# Patient Record
Sex: Female | Born: 1990 | Race: Black or African American | Hispanic: No | Marital: Single | Smoking: Current every day smoker
Health system: Southern US, Community
[De-identification: ages and names within clinical notes are randomized; demographics above are authoritative.]

## PROBLEM LIST (undated history)

## (undated) DIAGNOSIS — J45909 Unspecified asthma, uncomplicated: Secondary | ICD-10-CM

---
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
## 2010-08-23 ENCOUNTER — Emergency Department: Payer: Self-pay | Admitting: Emergency Medicine

## 2010-10-12 ENCOUNTER — Emergency Department: Payer: Self-pay | Admitting: Internal Medicine

## 2011-09-26 ENCOUNTER — Emergency Department: Payer: Self-pay | Admitting: Emergency Medicine

## 2011-10-19 ENCOUNTER — Emergency Department: Payer: Self-pay | Admitting: Emergency Medicine

## 2011-10-19 LAB — COMPREHENSIVE METABOLIC PANEL
Albumin: 3.6 g/dL (ref 3.4–5.0)
Alkaline Phosphatase: 83 U/L (ref 50–136)
Anion Gap: 7 (ref 7–16)
BUN: 9 mg/dL (ref 7–18)
Bilirubin,Total: 0.3 mg/dL (ref 0.2–1.0)
Calcium, Total: 9.1 mg/dL (ref 8.5–10.1)
Chloride: 106 mmol/L (ref 98–107)
Co2: 27 mmol/L (ref 21–32)
EGFR (African American): >60
EGFR (Non-African Amer.): >60
Potassium: 4 mmol/L (ref 3.5–5.1)
SGOT(AST): 59 U/L — ABNORMAL HIGH (ref 15–37)
Total Protein: 8.1 g/dL (ref 6.4–8.2)

## 2011-10-19 LAB — CBC
HCT: 37.3 % (ref 35.0–47.0)
HGB: 12.3 g/dL (ref 12.0–16.0)
MCHC: 33 g/dL (ref 32.0–36.0)
MCV: 95 fL (ref 80–100)
Platelet: 368 10*3/uL (ref 150–440)
WBC: 7.3 10*3/uL (ref 3.6–11.0)

## 2011-10-19 LAB — URINALYSIS, COMPLETE
Glucose,UR: NEGATIVE mg/dL (ref 0–75)
Nitrite: NEGATIVE
Protein: NEGATIVE
RBC,UR: 1 /HPF (ref 0–5)
Specific Gravity: 1.025 (ref 1.003–1.030)
Squamous Epithelial: 1
WBC UR: <1 /HPF (ref 0–5)

## 2011-10-19 LAB — DRUG SCREEN, URINE
Amphetamines, Ur Screen: NEGATIVE (ref ?–1000)
Barbiturates, Ur Screen: NEGATIVE (ref ?–200)
Cannabinoid 50 Ng, Ur ~~LOC~~: NEGATIVE (ref ?–50)
Cocaine Metabolite,Ur ~~LOC~~: NEGATIVE (ref ?–300)
Phencyclidine (PCP) Ur S: NEGATIVE (ref ?–25)

## 2011-12-12 ENCOUNTER — Emergency Department: Payer: Self-pay | Admitting: Emergency Medicine

## 2012-01-22 ENCOUNTER — Emergency Department: Payer: Self-pay | Admitting: Emergency Medicine

## 2012-01-23 LAB — COMPREHENSIVE METABOLIC PANEL
Albumin: 3.1 g/dL — ABNORMAL LOW (ref 3.4–5.0)
Anion Gap: 14 (ref 7–16)
Bilirubin,Total: 0.3 mg/dL (ref 0.2–1.0)
Chloride: 107 mmol/L (ref 98–107)
Co2: 22 mmol/L (ref 21–32)
Creatinine: 1.4 mg/dL — ABNORMAL HIGH (ref 0.60–1.30)
EGFR (African American): >60
EGFR (Non-African Amer.): 54 — ABNORMAL LOW
Glucose: 71 mg/dL (ref 65–99)
Osmolality: 282 (ref 275–301)
Potassium: 3.4 mmol/L — ABNORMAL LOW (ref 3.5–5.1)
SGOT(AST): 31 U/L (ref 15–37)
SGPT (ALT): 17 U/L (ref 12–78)
Sodium: 143 mmol/L (ref 136–145)
Total Protein: 6.9 g/dL (ref 6.4–8.2)

## 2012-01-23 LAB — DRUG SCREEN, URINE
Amphetamines, Ur Screen: NEGATIVE (ref ?–1000)
Barbiturates, Ur Screen: NEGATIVE (ref ?–200)
Benzodiazepine, Ur Scrn: NEGATIVE (ref ?–200)
Cannabinoid 50 Ng, Ur ~~LOC~~: POSITIVE (ref ?–50)
MDMA (Ecstasy)Ur Screen: NEGATIVE (ref ?–500)
Methadone, Ur Screen: NEGATIVE (ref ?–300)
Phencyclidine (PCP) Ur S: NEGATIVE (ref ?–25)
Tricyclic, Ur Screen: NEGATIVE (ref ?–1000)

## 2012-01-23 LAB — CBC
HCT: 39.8 % (ref 35.0–47.0)
MCH: 30.9 pg (ref 26.0–34.0)
MCV: 94 fL (ref 80–100)
RBC: 4.22 10*6/uL (ref 3.80–5.20)
RDW: 14.2 % (ref 11.5–14.5)
WBC: 15.5 10*3/uL — ABNORMAL HIGH (ref 3.6–11.0)

## 2012-01-23 LAB — ETHANOL
Ethanol %: <0.003 % (ref 0.000–0.080)
Ethanol: <3 mg/dL

## 2012-01-23 LAB — TSH: Thyroid Stimulating Horm: 0.82 u[IU]/mL

## 2013-06-12 ENCOUNTER — Emergency Department: Payer: Self-pay | Admitting: Internal Medicine
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
## 2014-08-26 NOTE — Consult Note (Signed)
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   Patient was seen by consultant.   Consult note dictated.   Recommend further assessment or treatment.   Orders entered.   Discussed with Attending MD.   Comments: Ms. Lamount CrankerChandrika has no psychiatric history except for [facility] for worsening depression in the context of severe skin condition-eczema. She has been referred [facility] and rheumatology but was unable to attend. Neither she was able to go [facility] to obtain free medications there. She reportedly threatened a suicide "unless her skin condition improves". She denies thoughts of hurting herself or others. She [facility].   PLAN: 1. She no longer meets criteria for IVC. I will terminate proceedings. Please discharge as appropriate.   2. She is to continue all medications as prescribed [facility]. Per her discharge list she is only on Neurontin 300 mg at night. She is on multiple creams.   3. She was given ALAMAP application to increase treatment compliance. I will provide Rx to go with it.   4. She will [facility].  Electronic Signatures: Kristine LineaPucilowska, Tyse Auriemma (MD)  (Signed 16-Sep-13 17:32)  Authored: Brief Consult Note   Last Updated: 16-Sep-13 17:32 by Kristine LineaPucilowska, Trezure Cronk (MD)
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
## 2014-10-20 ENCOUNTER — Emergency Department: Admission: EM | Admit: 2014-10-20 | Discharge: 2014-10-20 | Discharge status: Absent without leave | Payer: Self-pay

## 2017-10-16 ENCOUNTER — Emergency Department
Admission: EM | Admit: 2017-10-16 | Discharge: 2017-10-16 | Disposition: A | Discharge status: Home / Self Care | Payer: Self-pay | Attending: Emergency Medicine | Admitting: Emergency Medicine

## 2017-10-16 ENCOUNTER — Other Ambulatory Visit: Payer: Self-pay

## 2017-10-16 DIAGNOSIS — Z5321 Procedure and treatment not carried out due to patient leaving prior to being seen by health care provider: Secondary | ICD-10-CM | POA: Insufficient documentation

## 2017-10-16 DIAGNOSIS — Y999 Unspecified external cause status: Secondary | ICD-10-CM | POA: Insufficient documentation

## 2017-10-16 DIAGNOSIS — S0101XA Laceration without foreign body of scalp, initial encounter: Secondary | ICD-10-CM | POA: Insufficient documentation

## 2017-10-16 DIAGNOSIS — Y939 Activity, unspecified: Secondary | ICD-10-CM | POA: Insufficient documentation

## 2017-10-16 DIAGNOSIS — Y929 Unspecified place or not applicable: Secondary | ICD-10-CM | POA: Insufficient documentation
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
## 2017-11-04 ENCOUNTER — Other Ambulatory Visit: Payer: Self-pay

## 2017-11-04 ENCOUNTER — Emergency Department
Admission: EM | Admit: 2017-11-04 | Discharge: 2017-11-04 | Disposition: A | Discharge status: Home / Self Care | Payer: Self-pay | Attending: Emergency Medicine | Admitting: Emergency Medicine

## 2017-11-04 ENCOUNTER — Encounter: Payer: Self-pay | Admitting: *Deleted

## 2017-11-04 DIAGNOSIS — S0101XD Laceration without foreign body of scalp, subsequent encounter: Secondary | ICD-10-CM | POA: Insufficient documentation

## 2017-11-04 DIAGNOSIS — Z4802 Encounter for removal of sutures: Secondary | ICD-10-CM

## 2017-11-04 DIAGNOSIS — X58XXXD Exposure to other specified factors, subsequent encounter: Secondary | ICD-10-CM | POA: Insufficient documentation
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
## 2017-11-04 NOTE — Discharge Instructions (Addendum)
Follow-up with your primary care or [facility] acute care if any continued problems.

## 2017-11-04 NOTE — ED Triage Notes (Signed)
Pt here to have three staples removed from the right side of head. Pt reports headaches but denies fevers or drainage from wound. Staples have been present for three weeks.

## 2019-02-06 ENCOUNTER — Emergency Department: Payer: HRSA Program

## 2019-02-06 ENCOUNTER — Other Ambulatory Visit: Payer: Self-pay

## 2019-02-06 ENCOUNTER — Emergency Department
Admission: EM | Admit: 2019-02-06 | Discharge: 2019-02-06 | Disposition: A | Discharge status: Home / Self Care | Payer: HRSA Program | Attending: Emergency Medicine | Admitting: Emergency Medicine

## 2019-02-06 ENCOUNTER — Encounter: Payer: Self-pay | Admitting: Emergency Medicine

## 2019-02-06 DIAGNOSIS — J069 Acute upper respiratory infection, unspecified: Secondary | ICD-10-CM | POA: Diagnosis not present

## 2019-02-06 DIAGNOSIS — Z20828 Contact with and (suspected) exposure to other viral communicable diseases: Secondary | ICD-10-CM | POA: Insufficient documentation

## 2019-02-06 DIAGNOSIS — J45901 Unspecified asthma with (acute) exacerbation: Secondary | ICD-10-CM | POA: Diagnosis not present

## 2019-02-06 DIAGNOSIS — R05 Cough: Secondary | ICD-10-CM | POA: Diagnosis present

## 2019-02-06 DIAGNOSIS — R059 Cough, unspecified: Secondary | ICD-10-CM

## 2019-02-06 MED ORDER — METHYLPREDNISOLONE SODIUM SUCC 125 MG IJ SOLR
125.0000 mg | Freq: Once | INTRAMUSCULAR | Status: AC
  Administered 2019-02-06: 21:00:00 125 mg via INTRAMUSCULAR
  Filled 2019-02-06: qty 2

## 2019-02-06 MED ORDER — PREDNISONE 20 MG PO TABS: 40.0000 mg | ORAL_TABLET | Freq: Every day | ORAL | 0 refills | Status: DC

## 2019-02-06 NOTE — ED Notes (Signed)
E-signature not working at this time. Pt verbalized understanding of D/C instructions, prescriptions and follow up care with no further questions at this time. Pt in NAD and ambulatory at time of D/C.  

## 2019-02-06 NOTE — Discharge Instructions (Signed)
Please stay at home and quarantine the next 2 days until your tests have resulted.  If your cover test comes back positive, you need to continue with quarantine for total of 10 days.  If any point you become short of breath, develop chest pain, worsening wheezing, fevers, or any urgent changes in your health please return to the emergency department.  Please continue with albuterol inhaler 2 puffs every 6 hours and take prednisone as prescribed for the next 5 days.

## 2019-02-06 NOTE — ED Triage Notes (Signed)
PT here to be tested for covid. + cough and body aches x2 days. PT appears fatigue

## 2019-02-06 NOTE — ED Provider Notes (Signed)
[facility] EMERGENCY DEPARTMENT Provider Note   CSN: [id number] Arrival date & time: 02/06/19  1831     History   Chief Complaint Chief Complaint  Patient presents with  . Cough    HPI [name] is a 28 y.o. female.  Presents the emergency department for evaluation of nonproductive cough, body aches wheezing and fatigue.  Symptoms been present for 2 days.  She denies any chest pain, shortness of breath.  Has a history of asthma, on albuterol but has not used this medication today.  Patient states she took 3 puffs of albuterol yesterday without relief.  She denies any fevers, chills, chest pain, shortness of breath.  No abdominal pain nausea vomiting or diarrhea.  No loss of smell or taste.  No known contacts with COVID.     HPI  History reviewed. No pertinent past medical history.  There are no active problems to display for this patient.   History reviewed. No pertinent surgical history.   OB History   No obstetric history on file.      Home Medications    Prior to Admission medications   Medication Sig Start Date End Date Taking? Authorizing Provider  predniSONE (DELTASONE) 20 MG tablet Take 2 tablets (40 mg total) by mouth daily. 02/06/19   Duanne Guess, PA-C    Family History No family history on file.  Social History Social History   Tobacco Use  . Smoking status: Never Smoker  . Smokeless tobacco: Never Used  Substance Use Topics  . Alcohol use: Yes    Frequency: Never    Comment: occasionaly  . Drug use: Never     Allergies   Patient has no known allergies.   Review of Systems Review of Systems  Constitutional: Negative for chills and fever.  HENT: Negative for congestion, ear discharge, rhinorrhea, sinus pressure, sinus pain, sore throat, trouble swallowing and voice change.   Respiratory: Positive for cough and wheezing. Negative for chest tightness, shortness of breath and stridor.   Cardiovascular:  Negative for chest pain.  Gastrointestinal: Negative for abdominal pain, diarrhea, nausea and vomiting.  Genitourinary: Negative for dysuria, flank pain and pelvic pain.  Musculoskeletal: Positive for myalgias. Negative for back pain.  Skin: Negative for rash.  Neurological: Negative for dizziness and headaches.     Physical Exam Updated Vital Signs BP (!) 134/93   Pulse (!) 111   Temp 98.4 F (36.9 C) (Oral)   Resp 20   Ht 5\' 3"  (1.6 m)   Wt 59 kg   LMP 01/14/2019   SpO2 97%   BMI 23.03 kg/m   Physical Exam Constitutional:      General: She is not in acute distress.    Appearance: She is well-developed.  HENT:     Head: Normocephalic and atraumatic.     Jaw: No trismus.     Right Ear: Hearing, tympanic membrane, ear canal and external ear normal.     Left Ear: Hearing, tympanic membrane, ear canal and external ear normal.     Nose: No rhinorrhea.     Mouth/Throat:     Mouth: Mucous membranes are moist.     Pharynx: No oropharyngeal exudate, posterior oropharyngeal erythema or uvula swelling.     Tonsils: No tonsillar exudate or tonsillar abscesses.  Eyes:     General:        Right eye: No discharge.        Left eye: No discharge.  Conjunctiva/sclera: Conjunctivae normal.  Neck:     Musculoskeletal: Normal range of motion.  Cardiovascular:     Rate and Rhythm: Regular rhythm. Tachycardia present.  Pulmonary:     Effort: Pulmonary effort is normal. No respiratory distress.     Breath sounds: No stridor. Wheezing present. No rales.  Abdominal:     General: There is no distension.     Palpations: Abdomen is soft.     Tenderness: There is no abdominal tenderness.  Musculoskeletal: Normal range of motion.        General: No deformity.  Lymphadenopathy:     Cervical: No cervical adenopathy.  Skin:    General: Skin is warm and dry.  Neurological:     Mental Status: She is alert and oriented to person, place, and time.     Deep Tendon Reflexes: Reflexes are  normal and symmetric.  Psychiatric:        Behavior: Behavior normal.        Thought Content: Thought content normal.      ED Treatments / Results  Labs (all labs ordered are listed, but only abnormal results are displayed) Labs Reviewed  NOVEL CORONAVIRUS, NAA (HOSP ORDER, SEND-OUT TO REF LAB; TAT 18-24 HRS)    EKG None  Radiology Dg Chest 1 View  Result Date: 02/06/2019 CLINICAL DATA:  Cough. Body aches. EXAM: CHEST  1 VIEW COMPARISON:  None. FINDINGS: The cardiomediastinal contours are normal. The lungs are clear. Pulmonary vasculature is normal. No consolidation, pleural effusion, or pneumothorax. No acute osseous abnormalities are seen. IMPRESSION: Negative AP view of the chest. Electronically Signed   By: Narda Rutherford M.D.   On: 02/06/2019 20:05    Procedures Procedures (including critical care time)  Medications Ordered in ED Medications  methylPREDNISolone sodium succinate (SOLU-MEDROL) 125 mg/2 mL injection 125 mg (125 mg Intramuscular Given 02/06/19 2032)     Initial Impression / Assessment and Plan / ED Course  I have reviewed the triage vital signs and the nursing notes.  Pertinent labs & imaging results that were available during my care of the patient were reviewed by me and considered in my medical decision making (see chart for details).        28 year old female with 2-day history of wheezing.  Patient with mild nonproductive cough, body aches.  Chest x-ray negative.  Vital signs stable.  Normal oxygen saturation and heart rate.  Patient given Solu-Medrol IM and given 2 puffs of her albuterol inhaler, noted significant improvement in her wheezing.  On exam wheezing nearly resolved.  Patient moving air well in both lungs with very minimal external wheezing.  She is sent home with continuation of albuterol every 6 hours as needed with a 5-day steroid taper.  COVID test is pending.  She is educated on Nature conservation officer.  Final Clinical Impressions(s) / ED  Diagnoses   Final diagnoses:  Mild asthma with exacerbation, unspecified whether persistent  Viral upper respiratory tract infection    ED Discharge Orders         Ordered    predniSONE (DELTASONE) 20 MG tablet  Daily     02/06/19 2137           Ronnette Juniper 02/06/19 2147    Sharyn Creamer, MD 02/06/19 2355

## 2019-02-08 LAB — NOVEL CORONAVIRUS, NAA (HOSP ORDER, SEND-OUT TO REF LAB; TAT 18-24 HRS): SARS-CoV-2, NAA: NOT DETECTED

## 2019-05-30 ENCOUNTER — Ambulatory Visit: Payer: Self-pay | Admitting: Physician Assistant

## 2019-05-30 ENCOUNTER — Other Ambulatory Visit: Payer: Self-pay

## 2019-05-30 DIAGNOSIS — N760 Acute vaginitis: Secondary | ICD-10-CM

## 2019-05-30 DIAGNOSIS — B9689 Other specified bacterial agents as the cause of diseases classified elsewhere: Secondary | ICD-10-CM

## 2019-05-30 DIAGNOSIS — Z113 Encounter for screening for infections with a predominantly sexual mode of transmission: Secondary | ICD-10-CM

## 2019-05-30 LAB — WET PREP FOR TRICH, YEAST, CLUE
Trichomonas Exam: NEGATIVE
Yeast Exam: NEGATIVE

## 2019-05-30 MED ORDER — METRONIDAZOLE 500 MG PO TABS: 500.0000 mg | ORAL_TABLET | Freq: Two times a day (BID) | ORAL | 0 refills | Status: AC

## 2019-06-01 ENCOUNTER — Encounter: Payer: Self-pay | Admitting: Physician Assistant

## 2019-06-01 NOTE — Progress Notes (Signed)
[facility] Department STI clinic/screening visit  Subjective:  [name] is a 29 y.o. female being seen today for an STI screening visit. The patient reports they do have symptoms.  Patient reports that they do not desire a pregnancy in the next year.   They reported they are not interested in discussing contraception today.  No LMP recorded.   Patient has the following medical conditions:  There are no problems to display for this patient.   No chief complaint on file.   HPI  Patient reports that she has had itching and yellow discharge for 1 week.  Denies other symptoms.  Declines blood work today.  LMP 05/23/2019, and normal.  See flowsheet for further details and programmatic requirements.    The following portions of the patient's history were reviewed and updated as appropriate: allergies, current medications, past medical history, past social history, past surgical history and problem list.  Objective:  There were no vitals filed for this visit.  Physical Exam Constitutional:      General: She is not in acute distress.    Appearance: Normal appearance. She is normal weight.  HENT:     Head: Normocephalic and atraumatic.     Comments: No nits, lice, or hair loss. No cervical, supraclavicular or axillary adenopathy.    Mouth/Throat:     Mouth: Mucous membranes are moist.     Pharynx: Oropharynx is clear. No oropharyngeal exudate or posterior oropharyngeal erythema.  Eyes:     Conjunctiva/sclera: Conjunctivae normal.  Pulmonary:     Effort: Pulmonary effort is normal.  Abdominal:     Palpations: Abdomen is soft. There is no mass.     Tenderness: There is no abdominal tenderness. There is no guarding or rebound.  Genitourinary:    General: Normal vulva.     Rectum: Normal.     Comments: External genitalia/pubic area without nits, lice, edema, erythema, lesions and inguinal adenopaty. Vagina with normal mucosa and moderate amount of thin, white  discharge, pH=>4.5. Cervix without visible lesions. Uterus firm, mobile, nt, no masses,no CMT, no adnexal tenderness or fullness. Musculoskeletal:     Cervical back: Neck supple. No tenderness.  Skin:    General: Skin is warm and dry.     Findings: No bruising, erythema, lesion or rash.  Neurological:     Mental Status: She is alert and oriented to person, place, and time.  Psychiatric:        Mood and Affect: Mood normal.        Behavior: Behavior normal.        Thought Content: Thought content normal.        Judgment: Judgment normal.      Assessment and Plan:  [name] is a 29 y.o. female presenting to the [facility] Department for STI screening  1. Screening for STD (sexually transmitted disease) Patient into clinic with symptoms.  Declines blood work today. Rec condoms/dental dams with all sex. Await test results.  Counseled that RN will call if needs to RTC for further treatment once results are back. - WET PREP FOR [place], YEAST, [facility]  2. BV (bacterial vaginosis) Will treat BV with Metronidazole 500mg  #14 1 po BID for 7 days with food, no EtOH for 24 hr before and until 72 hr after completing medicine. No sex for 7 days. Enc to use OTC antifungal cream if has itching during or after taking antibiotic. - metroNIDAZOLE (FLAGYL) 500 MG tablet; Take  1 tablet (500 mg total) by mouth 2 (two) times daily for 7 days.  Dispense: 14 tablet; Refill: 0     No follow-ups on file.  No future appointments.  Matt Holmes, PA

## 2019-07-07 NOTE — Progress Notes (Signed)
Chart reviewed by Pharmacist  Suzanne Walker PharmD, Contract Pharmacist at [facility]  

## 2021-01-07 IMAGING — DX DG CHEST 1V
1 series · 2 of 2 positions shown · non-contrast
Comparison: None.

CLINICAL DATA: Cough. Body aches.

EXAM:
CHEST  1 VIEW

[Series 1: chest ap · 0.14mm/px · 2 of 2 slices shown]
[im 1/2]
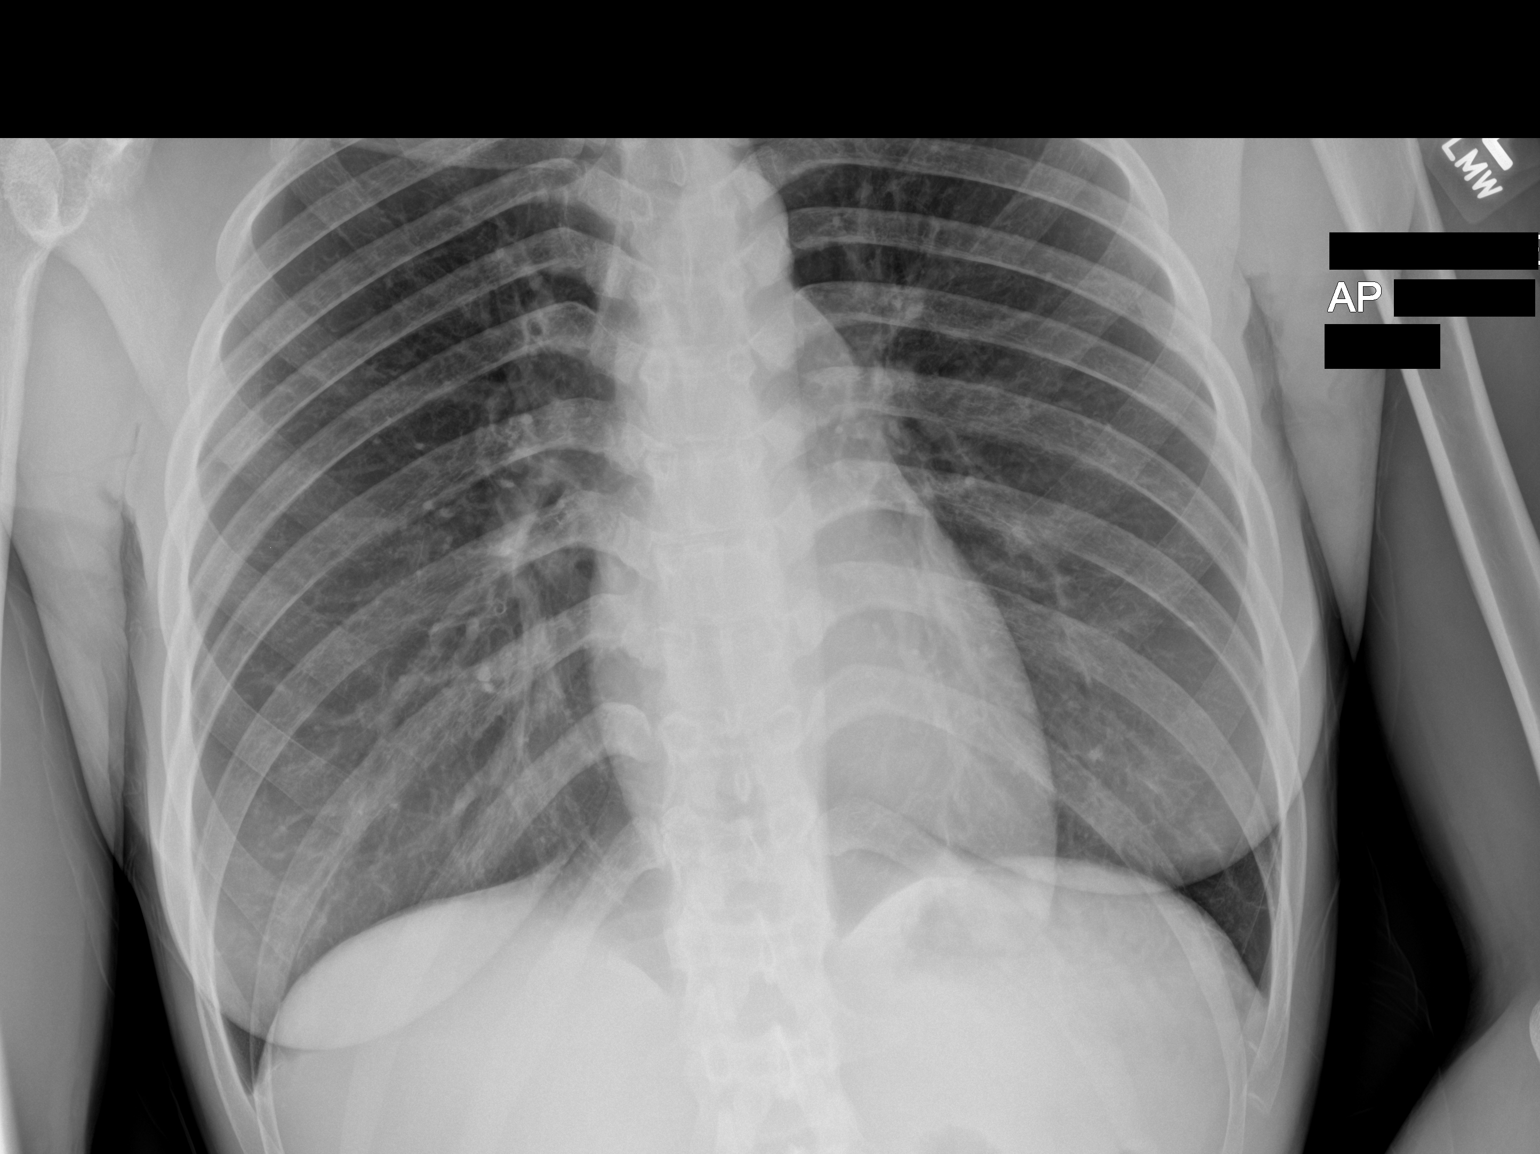
[im 2/2]
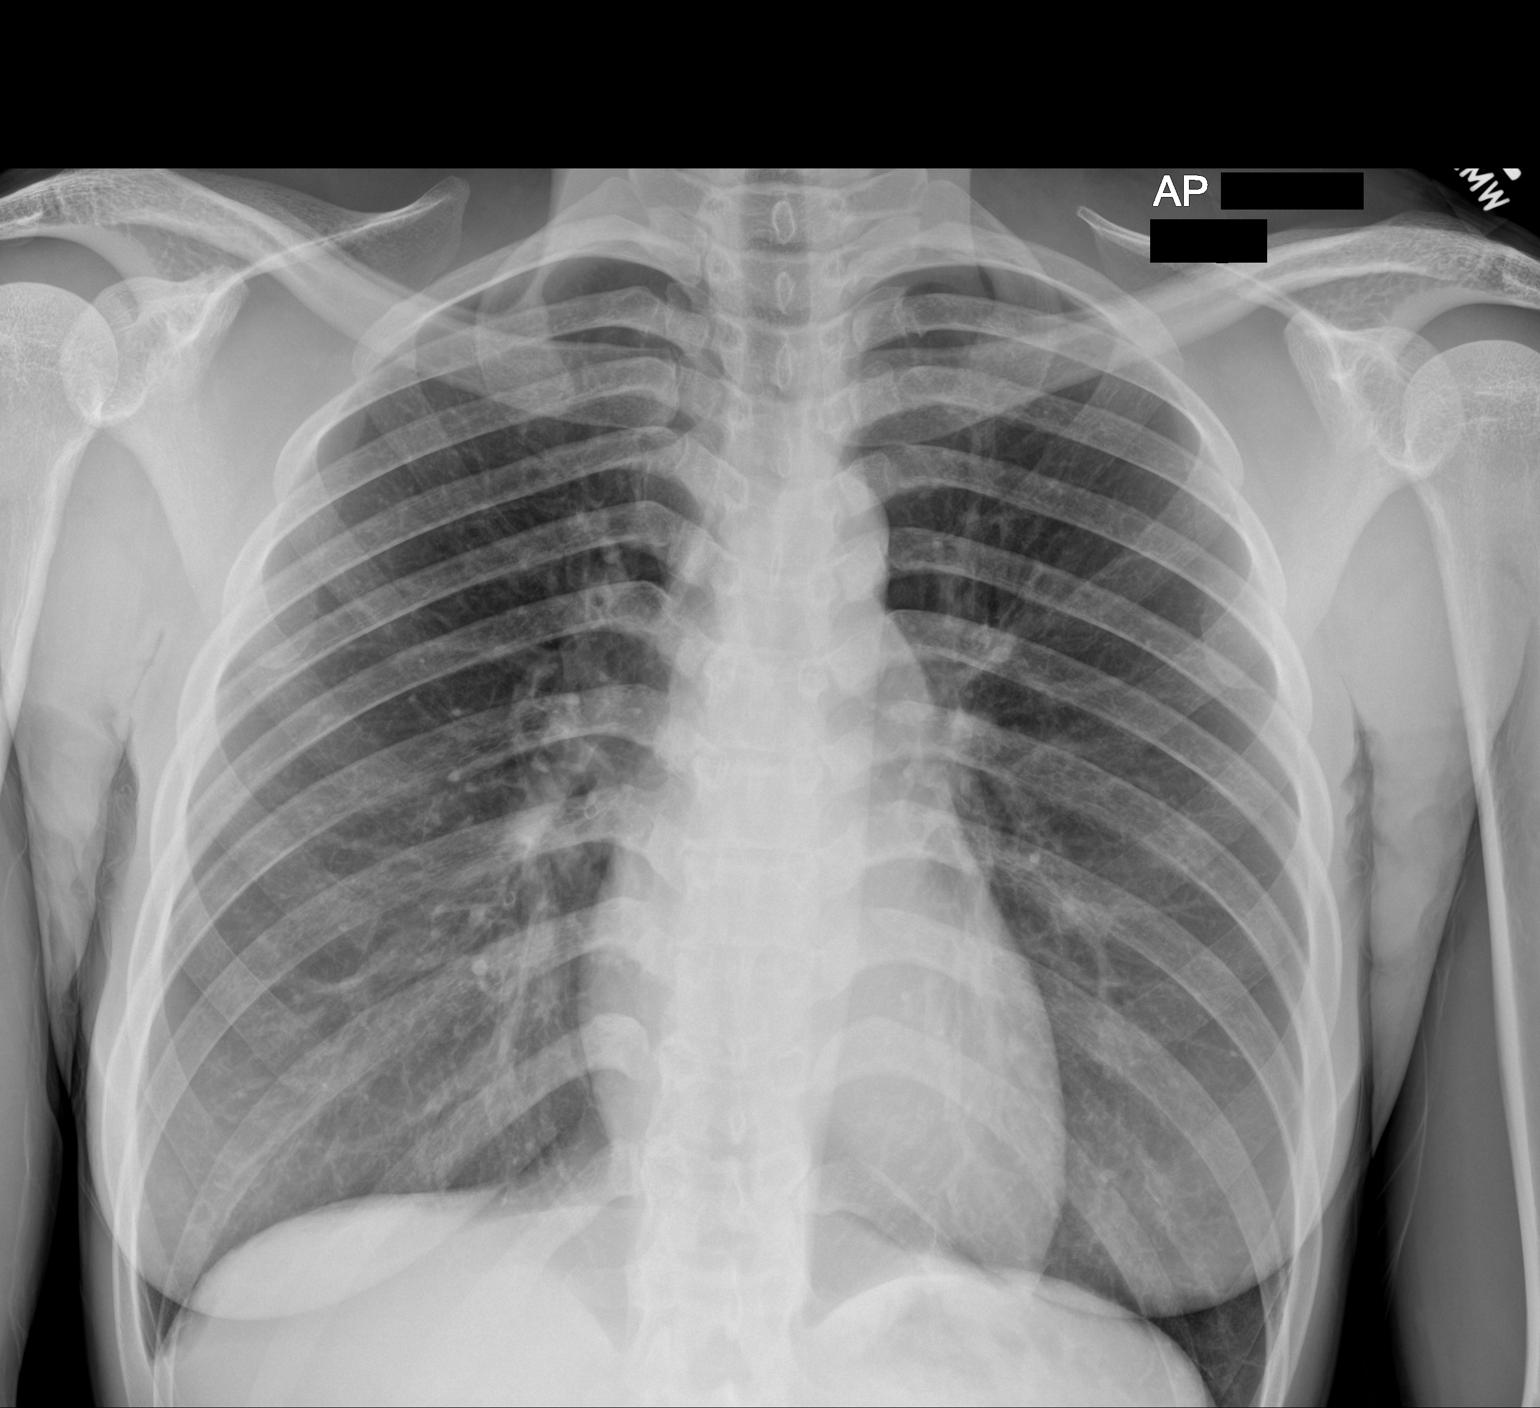

[2 of 2 positions shown; findings below may reference images not displayed]

FINDINGS: The cardiomediastinal contours are normal. The lungs are clear.
Pulmonary vasculature is normal. No consolidation, pleural effusion,
or pneumothorax. No acute osseous abnormalities are seen.
IMPRESSION: Negative AP view of the chest.

## 2021-03-27 ENCOUNTER — Emergency Department: Payer: Self-pay

## 2021-03-27 ENCOUNTER — Other Ambulatory Visit: Payer: Self-pay

## 2021-03-27 ENCOUNTER — Emergency Department
Admission: EM | Admit: 2021-03-27 | Discharge: 2021-03-27 | Disposition: A | Discharge status: Home / Self Care | Payer: Self-pay | Attending: Emergency Medicine | Admitting: Emergency Medicine

## 2021-03-27 ENCOUNTER — Encounter: Payer: Self-pay | Admitting: Emergency Medicine

## 2021-03-27 DIAGNOSIS — F17200 Nicotine dependence, unspecified, uncomplicated: Secondary | ICD-10-CM | POA: Insufficient documentation

## 2021-03-27 DIAGNOSIS — S62316A Displaced fracture of base of fifth metacarpal bone, right hand, initial encounter for closed fracture: Secondary | ICD-10-CM | POA: Insufficient documentation

## 2021-03-27 DIAGNOSIS — J45909 Unspecified asthma, uncomplicated: Secondary | ICD-10-CM | POA: Insufficient documentation

## 2021-03-27 DIAGNOSIS — Z23 Encounter for immunization: Secondary | ICD-10-CM | POA: Insufficient documentation

## 2021-03-27 DIAGNOSIS — W228XXA Striking against or struck by other objects, initial encounter: Secondary | ICD-10-CM | POA: Insufficient documentation

## 2021-03-27 HISTORY — DX: Unspecified asthma, uncomplicated: J45.909

## 2021-03-27 MED ORDER — HYDROCODONE-ACETAMINOPHEN 5-325 MG PO TABS
1.0000 | ORAL_TABLET | Freq: Once | ORAL | Status: AC
  Administered 2021-03-27: 1 via ORAL
  Filled 2021-03-27: qty 1

## 2021-03-27 MED ORDER — HYDROCODONE-ACETAMINOPHEN 5-325 MG PO TABS: 1.0000 | ORAL_TABLET | Freq: Four times a day (QID) | ORAL | 0 refills | Status: DC | PRN

## 2021-03-27 MED ORDER — TETANUS-DIPHTH-ACELL PERTUSSIS 5-2.5-18.5 LF-MCG/0.5 IM SUSY
0.5000 mL | PREFILLED_SYRINGE | Freq: Once | INTRAMUSCULAR | Status: AC
  Administered 2021-03-27: 0.5 mL via INTRAMUSCULAR
  Filled 2021-03-27: qty 0.5

## 2021-03-27 MED ORDER — CEPHALEXIN 500 MG PO CAPS: 500.0000 mg | ORAL_CAPSULE | Freq: Three times a day (TID) | ORAL | 0 refills | Status: DC

## 2021-03-27 NOTE — ED Triage Notes (Signed)
Pt to ED via POV with c/o R hand pain after punching a wall. Pt with small laceration noted, bleeding controlled on arrival. Pt noted to be tearful in triage  and speaking on cell phone.

## 2021-03-27 NOTE — ED Triage Notes (Signed)
Pt noted to become verbally aggressive while this RN asking triage questions, states "y'all be on some bullshit, this is why I don't like coming to this hospital".

## 2021-03-27 NOTE — ED Notes (Signed)
Called for pt in the lobby, no answer.

## 2021-03-27 NOTE — Discharge Instructions (Signed)
Call Monday make an appointment with Dr. Deeann Saint who is at [facility].  His contact information and address are listed on your discharge papers.  Ice and elevate to help with pain and reduce swelling.  Keep area clean and dry.  Do not remove the splint that was applied to your hand until you are seen by Dr. Hyacinth Meeker.  A prescription for antibiotics and pain medication was sent to your pharmacy.  Take only as directed.

## 2021-03-27 NOTE — ED Notes (Signed)
See triage note. Pt ambulatory to room. Pt stating she punched a wall. Laceration, dried blood and swelling noted to right hand. Bleeding controlled at this time and pt applying ice to extremity.
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
## 2021-06-22 ENCOUNTER — Other Ambulatory Visit: Payer: Self-pay

## 2021-06-22 ENCOUNTER — Emergency Department
Admission: EM | Admit: 2021-06-22 | Discharge: 2021-06-22 | Disposition: A | Discharge status: Home / Self Care | Payer: Self-pay | Attending: Emergency Medicine | Admitting: Emergency Medicine

## 2021-06-22 DIAGNOSIS — R519 Headache, unspecified: Secondary | ICD-10-CM | POA: Insufficient documentation

## 2021-06-22 DIAGNOSIS — R112 Nausea with vomiting, unspecified: Secondary | ICD-10-CM | POA: Insufficient documentation

## 2021-06-22 DIAGNOSIS — R1084 Generalized abdominal pain: Secondary | ICD-10-CM | POA: Insufficient documentation

## 2021-06-22 DIAGNOSIS — R197 Diarrhea, unspecified: Secondary | ICD-10-CM | POA: Insufficient documentation

## 2021-06-22 DIAGNOSIS — R072 Precordial pain: Secondary | ICD-10-CM | POA: Insufficient documentation

## 2021-06-22 LAB — COMPREHENSIVE METABOLIC PANEL
ALT: 12 U/L (ref 0–44)
AST: 19 U/L (ref 15–41)
Albumin: 4.2 g/dL (ref 3.5–5.0)
Alkaline Phosphatase: 40 U/L (ref 38–126)
Anion gap: 6 (ref 5–15)
BUN: 13 mg/dL (ref 6–20)
CO2: 24 mmol/L (ref 22–32)
Calcium: 8.8 mg/dL — ABNORMAL LOW (ref 8.9–10.3)
Chloride: 106 mmol/L (ref 98–111)
Creatinine, Ser: 0.68 mg/dL (ref 0.44–1.00)
GFR, Estimated: >60 mL/min (ref >60)
Glucose, Bld: 96 mg/dL (ref 70–99)
Potassium: 3.8 mmol/L (ref 3.5–5.1)
Sodium: 136 mmol/L (ref 135–145)
Total Bilirubin: 0.7 mg/dL (ref 0.3–1.2)
Total Protein: 7.4 g/dL (ref 6.5–8.1)

## 2021-06-22 LAB — URINALYSIS, ROUTINE W REFLEX MICROSCOPIC
Bilirubin Urine: NEGATIVE
Glucose, UA: NEGATIVE mg/dL
Ketones, ur: 5 mg/dL — AB
Nitrite: NEGATIVE
Protein, ur: NEGATIVE mg/dL
Specific Gravity, Urine: 1.025 (ref 1.005–1.030)
pH: 7 (ref 5.0–8.0)

## 2021-06-22 LAB — POC URINE PREG, ED: Preg Test, Ur: NEGATIVE

## 2021-06-22 LAB — CBC
HCT: 36 % (ref 36.0–46.0)
Hemoglobin: 11.5 g/dL — ABNORMAL LOW (ref 12.0–15.0)
MCH: 28.3 pg (ref 26.0–34.0)
MCHC: 31.9 g/dL (ref 30.0–36.0)
MCV: 88.7 fL (ref 80.0–100.0)
Platelets: 264 10*3/uL (ref 150–400)
RBC: 4.06 MIL/uL (ref 3.87–5.11)
RDW: 17.2 % — ABNORMAL HIGH (ref 11.5–15.5)
WBC: 11.2 10*3/uL — ABNORMAL HIGH (ref 4.0–10.5)
nRBC: 0 % (ref 0.0–0.2)

## 2021-06-22 LAB — LIPASE, BLOOD: Lipase: 33 U/L (ref 11–51)

## 2021-06-22 MED ORDER — PANTOPRAZOLE SODIUM 40 MG PO TBEC: 40.0000 mg | DELAYED_RELEASE_TABLET | Freq: Every day | ORAL | 0 refills | Status: AC

## 2021-06-22 MED ORDER — DICYCLOMINE HCL 10 MG PO CAPS: 10.0000 mg | ORAL_CAPSULE | Freq: Three times a day (TID) | ORAL | 0 refills | Status: AC

## 2021-06-22 MED ORDER — ONDANSETRON 4 MG PO TBDP
4.0000 mg | ORAL_TABLET | Freq: Once | ORAL | Status: AC | PRN
  Administered 2021-06-22: 4 mg via ORAL
  Filled 2021-06-22: qty 1

## 2021-06-22 MED ORDER — ALUM & MAG HYDROXIDE-SIMETH 200-200-20 MG/5ML PO SUSP
15.0000 mL | Freq: Once | ORAL | Status: AC
  Administered 2021-06-22: 15 mL via ORAL
  Filled 2021-06-22: qty 30

## 2021-06-22 MED ORDER — SUCRALFATE 1 G PO TABS
1.0000 g | ORAL_TABLET | Freq: Once | ORAL | Status: AC
  Administered 2021-06-22: 1 g via ORAL
  Filled 2021-06-22: qty 1

## 2021-06-22 MED ORDER — PANTOPRAZOLE SODIUM 40 MG PO TBEC
40.0000 mg | DELAYED_RELEASE_TABLET | Freq: Once | ORAL | Status: AC
  Administered 2021-06-22: 40 mg via ORAL
  Filled 2021-06-22: qty 1

## 2021-06-22 MED ORDER — ONDANSETRON HCL 4 MG PO TABS: 4.0000 mg | ORAL_TABLET | Freq: Three times a day (TID) | ORAL | 0 refills | Status: AC | PRN

## 2021-06-22 NOTE — ED Triage Notes (Addendum)
Pt c/o waking with nausea and vomiting about other day for the past several weeks ,denies any abd pain, denies pregnancy.

## 2021-06-22 NOTE — ED Provider Notes (Signed)
[facility] Provider Note    Event Date/Time   First MD Initiated Contact with Patient 06/22/21 1338     (approximate)   History   Emesis   HPI  [name] is a 31 y.o. female without any significant past medical history who presents for evaluation of several weeks of worsening near daily nausea and vomiting associate with generalized crampy abdominal pain and intermittent abdomen diarrhea.  Patient has also had some associated substernal burning chest discomfort and headaches.  She denies any EtOH use or prior similar episodes.  She does smoke cannabis regularly.  She denies any urinary symptoms vaginal bleeding or discharge or any blood in her stool.  She has not had any cough, fevers, back pain rash or extremity pain or any other clear associated symptoms.  Denies any other illicit drug use or tobacco abuse.  No other acute concerns at this time.      Physical Exam  Triage Vital Signs: ED Triage Vitals  Enc Vitals Group     BP 06/22/21 1301 102/69     Pulse Rate 06/22/21 1301 94     Resp 06/22/21 1301 17     Temp 06/22/21 1301 98.2 F (36.8 C)     Temp Source 06/22/21 1301 Oral     SpO2 06/22/21 1301 100 %     Weight 06/22/21 1308 125 lb (56.7 kg)     Height 06/22/21 1308 5\' 3"  (1.6 m)     Head Circumference --      Peak Flow --      Pain Score 06/22/21 1308 10     Pain Loc --      Pain Edu? --      Excl. in [place]? --     Most recent vital signs: Vitals:   06/22/21 1301  BP: 102/69  Pulse: 94  Resp: 17  Temp: 98.2 F (36.8 C)  SpO2: 100%    General: Awake, no distress.  CV:  Good peripheral perfusion.  No murmurs rubs or gallops. Resp:  Normal effort.  Clear bilaterally Abd:  No distention.  Soft and deep palpation mildly tender over the rectus muscles on soft palpation.  No CVA tenderness    ED Results / Procedures / Treatments  Labs (all labs ordered are listed, but only abnormal results are displayed) Labs Reviewed   COMPREHENSIVE METABOLIC PANEL - Abnormal; Notable for the following components:      Result Value   Calcium 8.8 (*)    All other components within normal limits  CBC - Abnormal; Notable for the following components:   WBC 11.2 (*)    Hemoglobin 11.5 (*)    RDW 17.2 (*)    All other components within normal limits  URINALYSIS, ROUTINE W REFLEX MICROSCOPIC - Abnormal; Notable for the following components:   Color, Urine YELLOW (*)    APPearance HAZY (*)    Hgb urine dipstick MODERATE (*)    Ketones, ur 5 (*)    Leukocytes,Ua TRACE (*)    Bacteria, UA RARE (*)    All other components within normal limits  URINE CULTURE  LIPASE, BLOOD  POC URINE PREG, ED     EKG  ECG shows sinus rhythm with a ventricular rate of 79 and nonspecific change in inferior lead III without any other evidence of acute ischemia or significant argument.  Normal axis.   RADIOLOGY    PROCEDURES:    MEDICATIONS ORDERED IN ED: Medications  ondansetron (ZOFRAN-ODT) disintegrating  tablet 4 mg (4 mg Oral Given 06/22/21 1312)  alum & mag hydroxide-simeth (MAALOX/MYLANTA) 200-200-20 MG/5ML suspension 15 mL (15 mLs Oral Given 06/22/21 1425)  pantoprazole (PROTONIX) EC tablet 40 mg (40 mg Oral Given 06/22/21 1425)  sucralfate (CARAFATE) tablet 1 g (1 g Oral Given 06/22/21 1425)     IMPRESSION / MDM / ASSESSMENT AND PLAN / ED COURSE  I reviewed the triage vital signs and the nursing notes.                              Differential diagnosis includes, but is not limited to infectious gastroenteritis, gastroparesis, pancreatitis, cholecystitis, cystitis, autoimmune colitis possible esophagitis from vomiting causing her chest pain.  ECG in absence of any risk factors for less concerning for ACS at this time.  Patient has no fever, change in abdominal discomfort over the last couple weeks, or  focal tenderness in right lower quadrant to suggest acute appendicitis or any localizing symptoms in the pelvis to suggest  torsion.  EKG is not suggestive of acute ischemia and there is no significant rhythm.  CMP without any significant likely metabolic derangements.  Is not consistent with pancreatitis.  CBC shows WBC count of 11.2 and hemoglobin 11.5 with normal platelets.  Pregnancy test is negative.  UA has some rare bacteria and trace leukocyte esterase but no WBCs or other evidence of infection and I have a low suspicion for clinically significant cystitis at this time.  Urine culture sent.  Patient is feeling better after some Zofran and GI cocktail.  I suspect some gastroparesis versus possible hyperemesis related to cannabis use versus possible infectious gastritis.  Low suspicion for other immediate life-threatening abdominal process at this time.  She is feeling better on my assessment after GI cocktail and able to tolerate p.o.  Given stable vitals otherwise reassuring exam work-up I think she is stable for discharge including outpatient evaluation.  Rx written for Protonix Zofran and Bentyl.  Discharged stable condition.  Strict return cautions advised and discussed      FINAL CLINICAL IMPRESSION(S) / ED DIAGNOSES   Final diagnoses:  Generalized abdominal pain  Nausea and vomiting, unspecified vomiting type  Diarrhea, unspecified type     Rx / DC Orders   ED Discharge Orders          Ordered    ondansetron (ZOFRAN) 4 MG tablet  Every 8 hours PRN        06/22/21 1508    dicyclomine (BENTYL) 10 MG capsule  3 times daily before meals        06/22/21 1508    pantoprazole (PROTONIX) 40 MG tablet  Daily        06/22/21 1508             Note:  This document was prepared using Dragon voice recognition software and may include unintentional dictation errors.   Lucrezia Starch, MD 06/22/21 [phone]

## 2021-06-22 NOTE — ED Provider Triage Note (Signed)
Emergency Medicine Provider Triage Evaluation Note  [name], a 31 y.o. female  was evaluated in triage.  Pt complains of nausea and vomiting for the last several days.  She denies any sick contacts, recent travel, or bad food exposure.  She denies any associated fevers or chills.  Review of Systems  Positive: N/V Negative: FCS  Physical Exam  BP 102/69    Pulse 94    Temp 98.2 F (36.8 C) (Oral)    Resp 17    Ht 5\' 3"  (1.6 m)    Wt 56.7 kg    LMP 06/17/2021 (Approximate)    SpO2 100%    BMI 22.14 kg/m  Gen:   Awake, no distress   Resp:  Normal effort  MSK:   Moves extremities without difficulty  Other:  ABD: soft, nontender  Medical Decision Making  Medically screening exam initiated at 1:21 PM.  Appropriate orders placed.  [name] was informed that the remainder of the evaluation will be completed by another provider, this initial triage assessment does not replace that evaluation, and the importance of remaining in the ED until their evaluation is complete.  Patient ED evaluation of nausea and vomiting with onset over the last 24 to 48 hours.   Cyril Loosen, PA-C 06/22/21 1328

## 2021-06-24 LAB — URINE CULTURE

## 2022-08-17 ENCOUNTER — Emergency Department: Payer: Self-pay

## 2022-08-17 ENCOUNTER — Emergency Department
Admission: EM | Admit: 2022-08-17 | Discharge: 2022-08-17 | Disposition: A | Discharge status: Home / Self Care | Payer: Self-pay | Attending: Emergency Medicine | Admitting: Emergency Medicine

## 2022-08-17 DIAGNOSIS — S161XXA Strain of muscle, fascia and tendon at neck level, initial encounter: Secondary | ICD-10-CM | POA: Insufficient documentation

## 2022-08-17 DIAGNOSIS — M62838 Other muscle spasm: Secondary | ICD-10-CM | POA: Insufficient documentation

## 2022-08-17 DIAGNOSIS — M542 Cervicalgia: Secondary | ICD-10-CM

## 2022-08-17 DIAGNOSIS — R519 Headache, unspecified: Secondary | ICD-10-CM | POA: Insufficient documentation

## 2022-08-17 MED ORDER — KETOROLAC TROMETHAMINE 30 MG/ML IJ SOLN
30.0000 mg | Freq: Once | INTRAMUSCULAR | Status: AC
  Administered 2022-08-17: 30 mg via INTRAMUSCULAR
  Filled 2022-08-17: qty 1

## 2022-08-17 MED ORDER — METHOCARBAMOL 500 MG PO TABS
500.0000 mg | ORAL_TABLET | Freq: Once | ORAL | Status: AC
  Administered 2022-08-17: 500 mg via ORAL
  Filled 2022-08-17: qty 1

## 2022-08-17 MED ORDER — LIDOCAINE 5 % EX PTCH
1.0000 | MEDICATED_PATCH | CUTANEOUS | Status: DC
  Administered 2022-08-17: 1 via TRANSDERMAL
  Filled 2022-08-17: qty 1

## 2022-08-17 MED ORDER — OXYCODONE HCL 5 MG PO TABS
5.0000 mg | ORAL_TABLET | Freq: Once | ORAL | Status: AC
  Administered 2022-08-17: 5 mg via ORAL
  Filled 2022-08-17: qty 1

## 2022-08-17 MED ORDER — METHOCARBAMOL 500 MG PO TABS: 500.0000 mg | ORAL_TABLET | Freq: Four times a day (QID) | ORAL | 0 refills | Status: AC | PRN

## 2022-08-17 MED ORDER — LIDOCAINE 5 % EX PTCH: 1.0000 | MEDICATED_PATCH | Freq: Two times a day (BID) | CUTANEOUS | 1 refills | Status: AC
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
## 2022-08-17 NOTE — Discharge Instructions (Addendum)
Please take Tylenol and ibuprofen/Advil for your pain.  It is safe to take them together, or to alternate them every few hours.  Take up to 1000mg  of Tylenol at a time, up to 4 times per day.  Do not take more than 4000 mg of Tylenol in 24 hours.  For ibuprofen, take 400-600 mg, 3 - 4 times per day.  Please use lidocaine patches at your site of pain.  Apply 1 patch at a time, leave on for 12 hours, then remove for 12 hours.  12 hours on, 12 hours off.  Do not apply more than 1 patch at a time.  Use the Robaxin muscle relaxers for more severe pain and muscle spasms.  This medication may make you little bit sleepy, so do not drive or operate machinery while taking it.  Return to the ED for right hand or arm stops working.
